# Patient Record
Sex: Female | Born: 1980 | Race: White | Hispanic: No | Marital: Married | Smoking: Never smoker
Health system: Southern US, Community
[De-identification: ages and names within clinical notes are randomized; demographics above are authoritative.]

## PROBLEM LIST (undated history)

## (undated) DIAGNOSIS — Z9289 Personal history of other medical treatment: Secondary | ICD-10-CM

## (undated) DIAGNOSIS — L509 Urticaria, unspecified: Secondary | ICD-10-CM

## (undated) DIAGNOSIS — R51 Headache: Secondary | ICD-10-CM

## (undated) DIAGNOSIS — R519 Headache, unspecified: Secondary | ICD-10-CM

## (undated) DIAGNOSIS — J302 Other seasonal allergic rhinitis: Secondary | ICD-10-CM

## (undated) DIAGNOSIS — K219 Gastro-esophageal reflux disease without esophagitis: Secondary | ICD-10-CM

## (undated) DIAGNOSIS — D649 Anemia, unspecified: Secondary | ICD-10-CM

## (undated) DIAGNOSIS — F419 Anxiety disorder, unspecified: Secondary | ICD-10-CM

## (undated) HISTORY — PX: WISDOM TOOTH EXTRACTION: SHX21

## (undated) HISTORY — DX: Headache: R51

## (undated) HISTORY — DX: Urticaria, unspecified: L50.9

## (undated) HISTORY — DX: Other seasonal allergic rhinitis: J30.2

## (undated) HISTORY — DX: Headache, unspecified: R51.9

## (undated) HISTORY — DX: Gastro-esophageal reflux disease without esophagitis: K21.9

## (undated) HISTORY — DX: Personal history of other medical treatment: Z92.89

## (undated) HISTORY — DX: Anxiety disorder, unspecified: F41.9

---

## 2005-05-28 HISTORY — PX: DILATION AND CURETTAGE OF UTERUS: SHX78

## 2007-12-10 ENCOUNTER — Ambulatory Visit: Payer: Self-pay | Admitting: Ophthalmology

## 2009-07-23 IMAGING — CT CT ORBITS WO/W CM
1 of 2 series · 13 of 30 positions shown, 17 images · non-contrast
Comparison: none

REASON FOR EXAM: lid edema  fax results [phone]
COMMENTS:

PROCEDURE:     CT  - CT ORBITS OR TEMPORAL BONE W/WO  - December 10, 2007  [DATE]
RESULT:
HISTORY: Bilateral lid edema.
COMPARISON STUDIES:   None.

[Series 2: orbits_wo 3.0 h30f · axial · 0.37mm/px · z∈[+218,+290]mm · 13 of 30 slices shown, 17 images]
[im 3/30  brain]
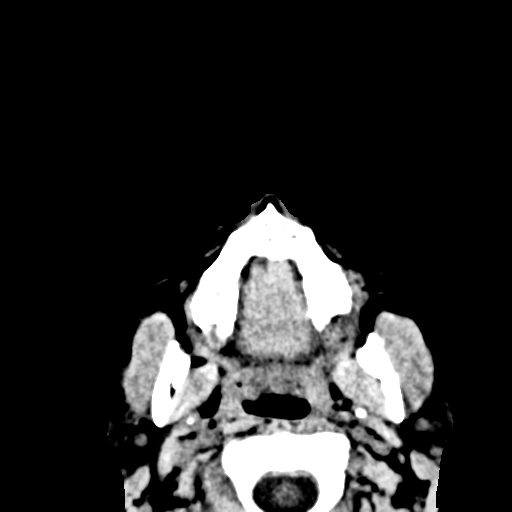
[im 3/30  bone]
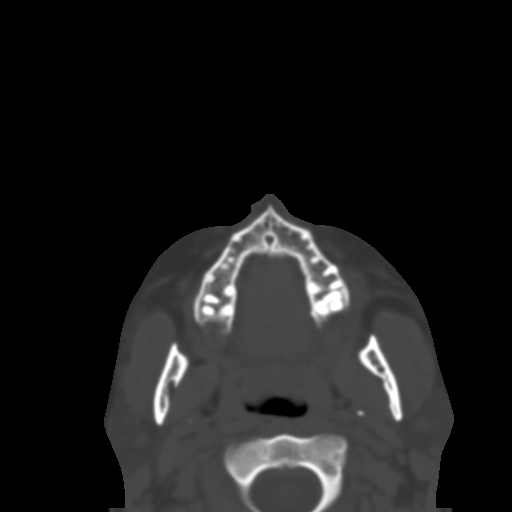
[im 5/30  bone]
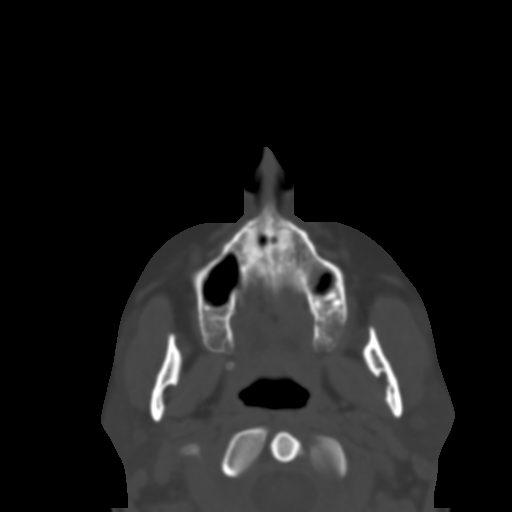
[im 7/30  bone]
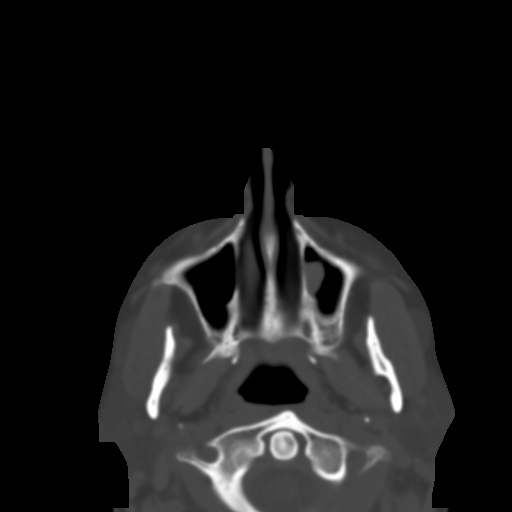
[im 9/30  bone]
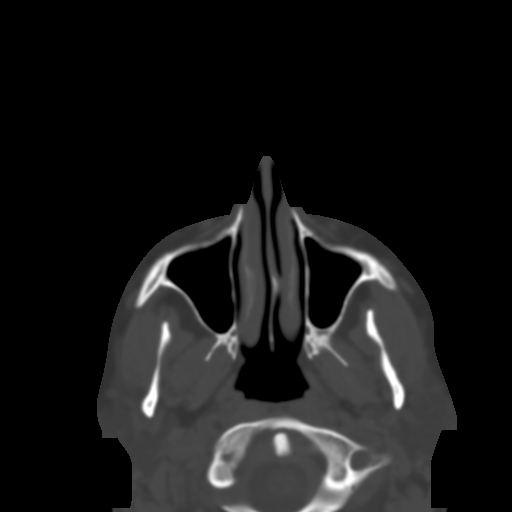
[im 11/30  brain]
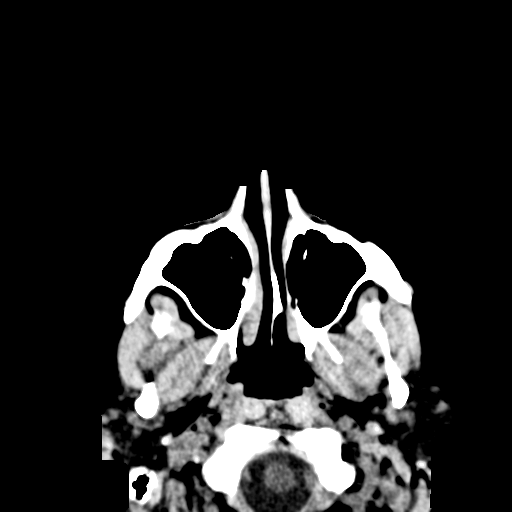
[im 11/30  bone]
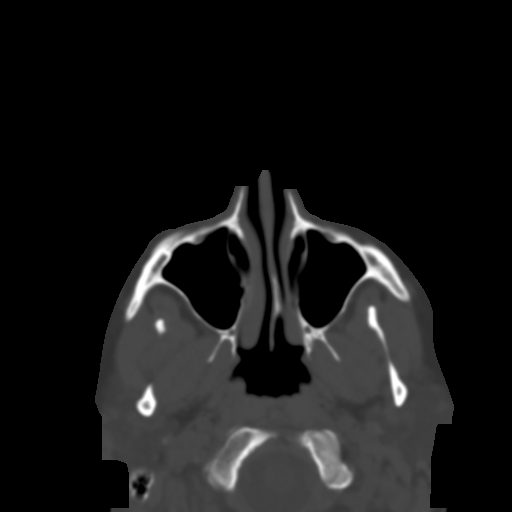
[im 13/30  bone]
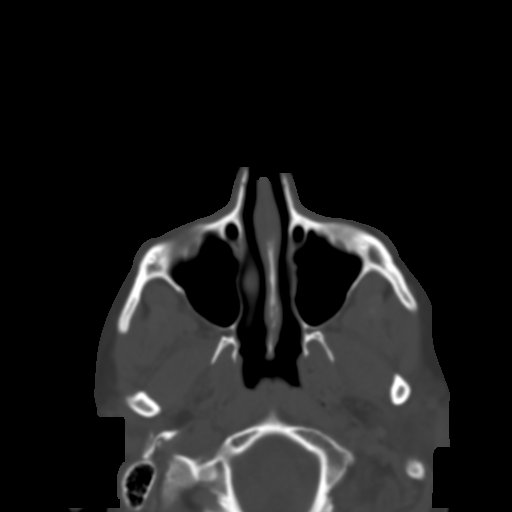
[im 15/30  bone]
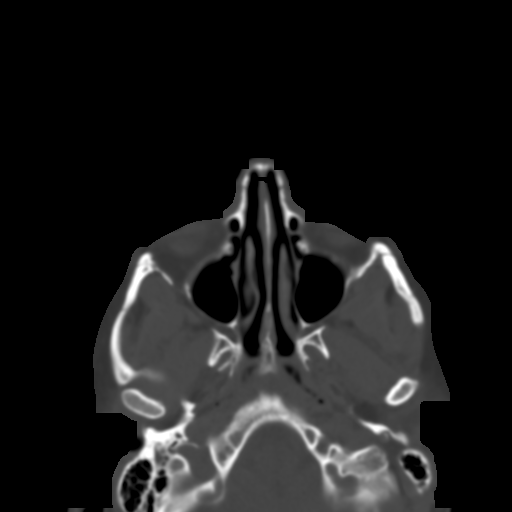
[im 17/30  bone]
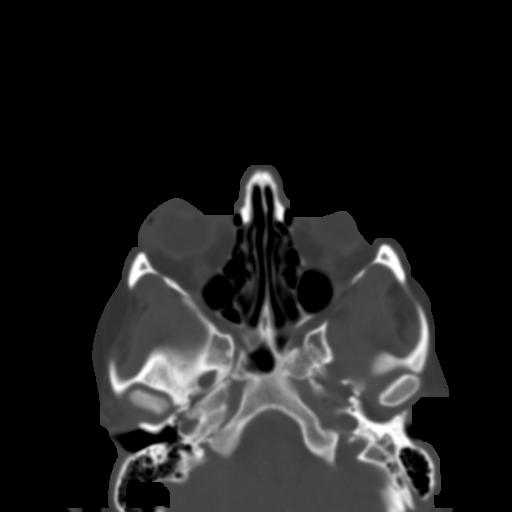
[im 19/30  brain]
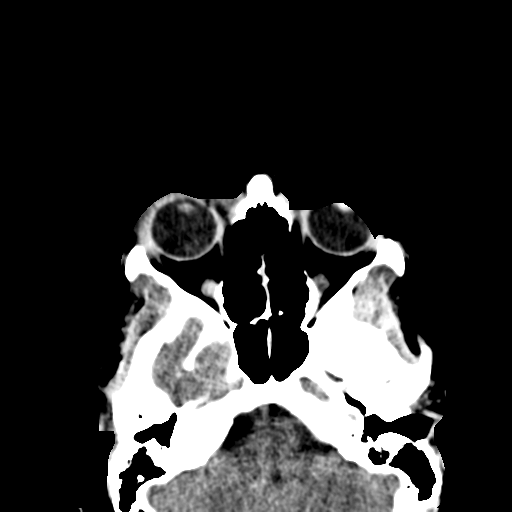
[im 19/30  bone]
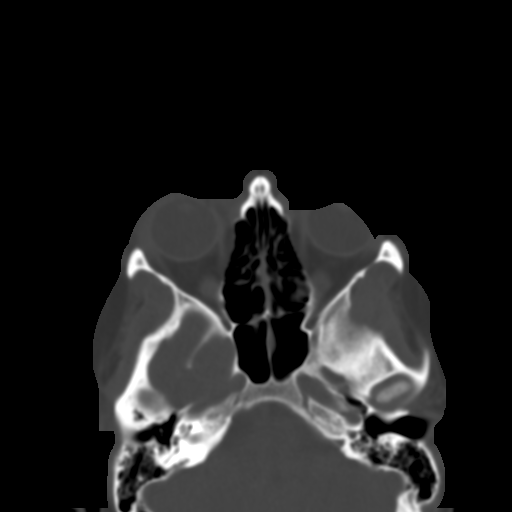
[im 21/30  bone]
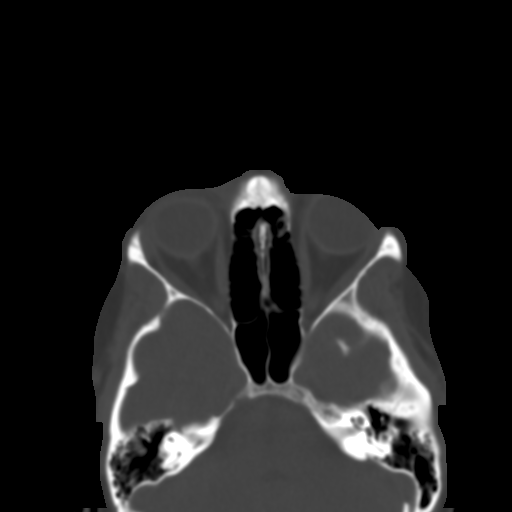
[im 23/30  bone]
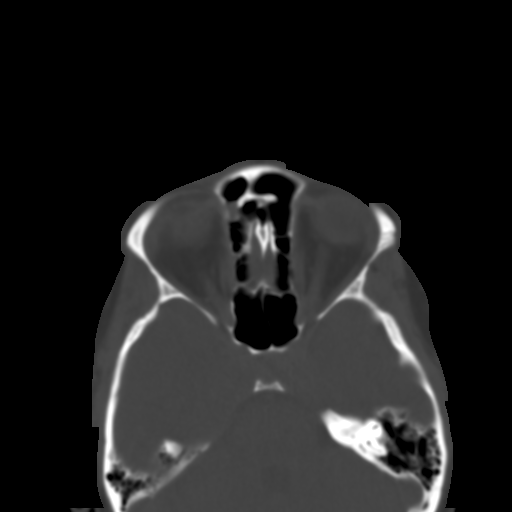
[im 25/30  bone]
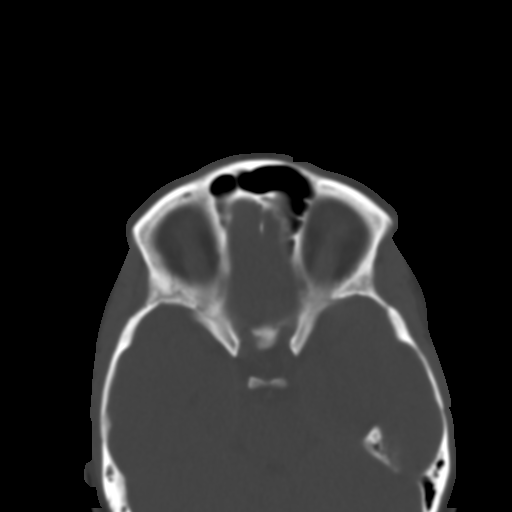
[im 27/30  brain]
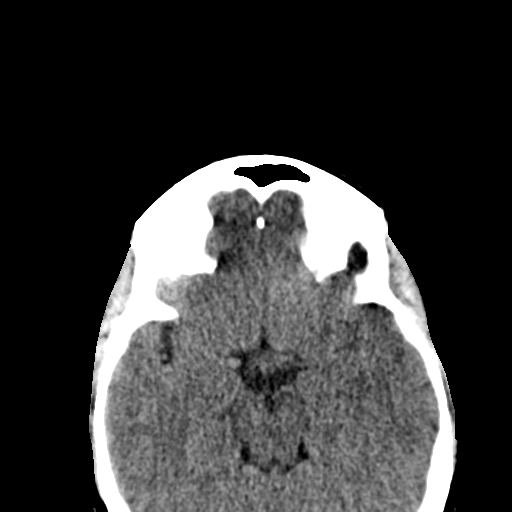
[im 27/30  bone]
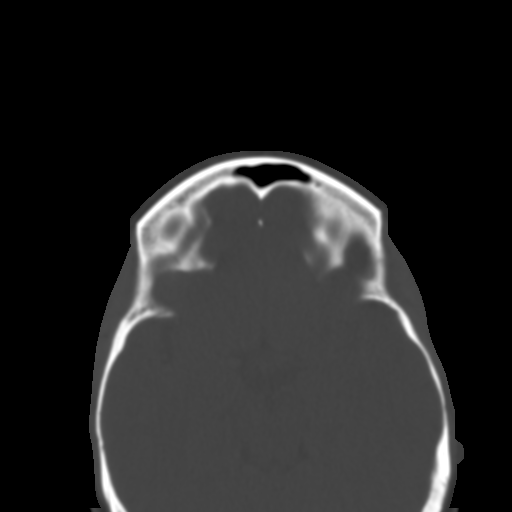

[13 of 30 positions shown; findings below may reference images not displayed]

FINDINGS: Soft tissue swelling is noted about the RIGHT orbit. The RIGHT
globe is intact.  Extraocular eye muscles are normal.  Retrobulbar tissue is
normal.  The LEFT eye and orbit are normal. No evidence of abscess.  The
paranasal sinuses are normal on the RIGHT.  Mild mucosal thickening is noted
in the LEFT maxillary sinus.
IMPRESSION: 1.     Soft tissue swelling, RIGHT orbit. No evidence of abscess. The globe
and retrobulbar tissue are intact.  Any etiology of periorbital soft tissue
swelling including cellulitis could present in this fashion.
2.     Adjacent bony structures and sinuses are normal.

## 2015-05-26 ENCOUNTER — Other Ambulatory Visit: Payer: Self-pay | Admitting: Otolaryngology

## 2015-05-26 DIAGNOSIS — H814 Vertigo of central origin: Secondary | ICD-10-CM

## 2015-06-17 ENCOUNTER — Ambulatory Visit
Admission: RE | Admit: 2015-06-17 | Discharge: 2015-06-17 | Disposition: A | Discharge status: Home / Self Care | Payer: BLUE CROSS/BLUE SHIELD | Source: Ambulatory Visit | Attending: Otolaryngology | Admitting: Otolaryngology

## 2015-06-17 DIAGNOSIS — H8149 Vertigo of central origin, unspecified ear: Secondary | ICD-10-CM | POA: Diagnosis present

## 2015-06-17 DIAGNOSIS — H814 Vertigo of central origin: Secondary | ICD-10-CM

## 2015-06-17 MED ORDER — GADOBENATE DIMEGLUMINE 529 MG/ML IV SOLN
20.0000 mL | Freq: Once | INTRAVENOUS | Status: AC | PRN
  Administered 2015-06-17: 20 mL via INTRAVENOUS

## 2017-12-03 ENCOUNTER — Ambulatory Visit: Payer: Self-pay | Admitting: Certified Nurse Midwife

## 2017-12-23 ENCOUNTER — Ambulatory Visit (INDEPENDENT_AMBULATORY_CARE_PROVIDER_SITE_OTHER): Payer: BLUE CROSS/BLUE SHIELD | Admitting: Certified Nurse Midwife

## 2017-12-23 ENCOUNTER — Encounter: Payer: Self-pay | Admitting: Certified Nurse Midwife

## 2017-12-23 VITALS — BP 118/80 | HR 97 | Ht 66.0 in | Wt 227.0 lb

## 2017-12-23 DIAGNOSIS — Z01419 Encounter for gynecological examination (general) (routine) without abnormal findings: Secondary | ICD-10-CM

## 2017-12-23 DIAGNOSIS — Z1231 Encounter for screening mammogram for malignant neoplasm of breast: Secondary | ICD-10-CM | POA: Diagnosis not present

## 2017-12-23 DIAGNOSIS — Z01411 Encounter for gynecological examination (general) (routine) with abnormal findings: Secondary | ICD-10-CM | POA: Diagnosis not present

## 2017-12-23 DIAGNOSIS — B373 Candidiasis of vulva and vagina: Secondary | ICD-10-CM | POA: Diagnosis not present

## 2017-12-23 DIAGNOSIS — N898 Other specified noninflammatory disorders of vagina: Secondary | ICD-10-CM | POA: Diagnosis not present

## 2017-12-23 DIAGNOSIS — Z30433 Encounter for removal and reinsertion of intrauterine contraceptive device: Secondary | ICD-10-CM

## 2017-12-23 DIAGNOSIS — B3731 Acute candidiasis of vulva and vagina: Secondary | ICD-10-CM

## 2017-12-23 DIAGNOSIS — T8332XA Displacement of intrauterine contraceptive device, initial encounter: Secondary | ICD-10-CM

## 2017-12-23 DIAGNOSIS — Z1239 Encounter for other screening for malignant neoplasm of breast: Secondary | ICD-10-CM

## 2017-12-23 MED ORDER — FLUCONAZOLE 150 MG PO TABS: 150.0000 mg | ORAL_TABLET | Freq: Once | ORAL | 0 refills | Status: AC

## 2017-12-23 NOTE — Progress Notes (Addendum)
Gynecology Annual Exam  PCP: System, Pcp Not In  Chief Complaint:  Chief Complaint  Patient presents with  . Gynecologic Exam    History of Present Illness:Kathleen Lopez is a 37 year old female , G 3 P 2 0 1 1 , who presents for her annual exam Her menses are every 21-22 days apart, lasting 3-4 days, medium flow. Her LMP was 12/07/2017  She has no intermenstrual  spotting.  She denies dysmenorrhea.  The patient's past medical history is notable for a history of migraines involving ear pain/ left parietal pain for which she takes Effexor and nortriptylene and is followed by Dr Malvin JohnsPotter in neuro. She also reports a hx of GERD and hives/ food allergies..  Since her last annual GYN exam 01/09/2016 , she has had no significant changes in her health history.  She is sexually active. She is currently using an IUD for contraception.Her Paragard was placed 05/09/2016.  Her most recent pap smear was obtained 01/09/2016 and was RNIL with negative HRHPV. Mammogram is not applicable.  There is a positive history of breast cancer in her maternal grandmother. Genetic testing has not been done.  There is no family history of ovarian cancer.  The patient does do occ self breast exams.  The patient does not smoke The patient does not drink alcohol.  The patient does not use illegal drugs.  The patient exercises occasionally.  The patient does get adequate calcium in her diet.  She had a recent cholesterol screen in 2016 that was normal.    Review of Systems: Review of Systems  Constitutional: Negative for chills, fever and weight loss.  HENT: Negative for congestion, sinus pain and sore throat.   Eyes: Negative for blurred vision and pain.  Respiratory: Negative for hemoptysis, shortness of breath and wheezing.   Cardiovascular: Negative for chest pain, palpitations and leg swelling.  Gastrointestinal: Positive for constipation. Negative for abdominal pain, blood in stool, diarrhea, heartburn,  nausea and vomiting.  Genitourinary: Negative for dysuria, frequency, hematuria and urgency.  Musculoskeletal: Negative for back pain, joint pain and myalgias.  Skin: Negative for itching and rash.  Neurological: Positive for headaches. Negative for dizziness and tingling.  Endo/Heme/Allergies: Negative for environmental allergies and polydipsia. Does not bruise/bleed easily.       Negative for hirsutism   Psychiatric/Behavioral: Negative for depression. The patient is not nervous/anxious and does not have insomnia.     Past Medical History:  Past Medical History:  Diagnosis Date  . Anxiety   . GERD (gastroesophageal reflux disease)   . Head ache   . History of Papanicolaou smear of cervix    paps nl in past;12/30/15 -/-;  . Urticaria     Past Surgical History:  Past Surgical History:  Procedure Laterality Date  . DILATION AND CURETTAGE OF UTERUS  2007   [facility]  . WISDOM TOOTH EXTRACTION     ~37yr old; two    Family History:  Family History  Problem Relation Age of Onset  . Osteoporosis Mother   . Cancer Father [id number]       prostate  . Breast cancer Maternal Grandmother 7050    Social History:  Social History   Socioeconomic History  . Marital status: Married    Spouse name: Not on file  . Number of children: 2  . Years of education: 2212  . Highest education level: Not on file  Occupational History  . 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 Financial resource strain: Not on file  . Food insecurity:    Worry: Not on file    Inability: Not on file  . Transportation needs:    Medical: Not on file    Non-medical: Not on file  Tobacco Use  . Smoking status: Never Smoker  . Smokeless tobacco: Never Used  Substance and Sexual Activity  . Alcohol use: Never    Frequency: Never  . Drug use: Never  . Sexual activity: Yes    Birth control/protection: IUD    Comment: Paraguard  Lifestyle  . Physical  activity:    Days per week: Not on file    Minutes per session: Not on file  . Stress: Not on file  Relationships  . Social connections:    Talks on phone: Not on file    Gets together: Not on file    Attends religious service: Not on file    Active member of club or organization: Not on file    Attends meetings of clubs or organizations: Not on file    Relationship status: Not on file  . Intimate partner violence:    Fear of current or ex partner: Not on file    Emotionally abused: Not on file    Physically abused: Not on file    Forced sexual activity: Not on file  Other Topics Concern  . Not on file  Social History Narrative  . Not on file    Allergies:  Allergies  Allergen Reactions  . Beef-Derived Products Hives  . Eggs Or Egg-Derived Products Hives    Face, lips, eyes  . Other     seasonal    Medications: Current Outpatient Medications on File Prior to Visit  Medication Sig Dispense Refill  . cetirizine (ZYRTEC) 10 MG tablet Take by mouth.    . Cholecalciferol (VITAMIN D3) 2000 units capsule Take by mouth.    . esomeprazole (NEXIUM) 40 MG capsule Take by mouth.    . Melatonin 5 MG TABS Take by mouth.    . montelukast (SINGULAIR) 10 MG tablet Take by mouth.    . nortriptyline (PAMELOR) 10 MG capsule TAKE 2 CAPSULES BY MOUTH AT BEDTIME    . rizatriptan (MAXALT-MLT) 5 MG disintegrating tablet TAKE 1 TABLET AS NEEDED FOR MIGRAINE,MAY TAKE A 2ND DOSE AFTER 2 HOURS IF NEEDED.    Marland Kitchen venlafaxine XR (EFFEXOR-XR) 75 MG 24 hr capsule Take by mouth.    . CONCERTA 18 MG CR tablet   0   No current facility-administered medications on file prior to visit.      Physical Exam Vitals: BP 118/80   Pulse 97   Ht 5\' 6"  (1.676 m)   Wt 227 lb (103 kg)   LMP 12/07/2017 (Exact Date)   BMI 36.64 kg/m   General: NAD HEENT: normocephalic, anicteric Neck: no thyroid enlargement, no palpable nodules, no cervical lymphadenopathy  Pulmonary: No increased work of breathing,  CTAB Cardiovascular: RRR, without murmur  Breast: Right breast larger than left, no tenderness, no palpable nodules or masses, no skin or nipple retraction present, no nipple discharge.  No axillary, infraclavicular or supraclavicular lymphadenopathy. Abdomen: Soft, non-tender, non-distended.  Umbilicus without lesions.  No hepatomegaly or masses palpable. No evidence of hernia. Genitourinary:  External: Normal external female genitalia.  Normal urethral meatus, normal  Bartholin's and Skene's glands.    Vagina: Normal vaginal mucosa, urethrocele present, white cottage cheese discharge  Cervix: Grossly normal  in appearance, no bleeding, non-tender, stem of IUD palpated outside cx os  Uterus: Anteverted, normal size, shape, and consistency, mobile, and non-tender  Adnexa: No adnexal masses, non-tender  Rectal: deferred  Lymphatic: no evidence of inguinal lymphadenopathy Extremities: no edema, erythema, or tenderness Neurologic: Grossly intact Psychiatric: mood appropriate, affect full  Results for orders placed or performed in visit on 12/23/17 (from the past 24 hour(s))  POCT Wet Prep Mellody [place])     Status: Abnormal   Collection Time: 12/28/17  2:46 PM  Result Value Ref Range   Source Wet Prep POC vagina    WBC, Wet Prep HPF POC     Bacteria Wet Prep HPF POC  Few   BACTERIA WET PREP MORPHOLOGY POC     Clue Cells Wet Prep HPF POC None None   Clue Cells Wet Prep Whiff POC     Yeast Wet Prep HPF POC Many (A) None   KOH Wet Prep POC  None   Trichomonas Wet Prep HPF POC Absent Absent     Assessment: 37 y.o. [id number] annual gyn exam Monilial vaginitis  Diflucan 150 mgmx1 dose  Partial expulsion of Paragard IUD  Plan:   1) Breast cancer screening - recommend monthly self breast exam. Start screening mammograms next year.Marland Kitchen  2) Cervical cancer screening - Pap not done. Desires Pap smears every 3 years. Next one due next year.  3) Contraception - Partially expelled IUD removed  intact. Please see note below for IUD insertion.  4) Routine healthcare maintenance including cholesterol and diabetes screening managed by PCP   5) RTO in one year and prn  Farrel Conners, CNM     GYNECOLOGY OFFICE PROCEDURE NOTE  Kathleen Lopez is a 37 y.o. [id number] WF for Paragard IUD insertion after her IUD was removed for partial expulsion. Has not experienced any abdominal pain or dyspareunia with her previous IUD  IUD Insertion Procedure Note Patient identified, informed consent performed, consent signed.   Discussed risks of irregular bleeding, cramping, infection, expulsion,malpositioning or misplacement of the IUD outside the uterus which may require further procedure such as laparoscopy. Time out was performed.   On bimanual exam, uterus was anteverted to mid position Speculum placed in the vagina.  Cervix visualized.  Cleaned with Betadine x 2. Cervix was sprayed with Hurricaine anesthetic and  grasped anteriorly with a single tooth tenaculum.  Uterus sounded to 6 cm.  Paragard  IUD placed per manufacturer's recommendations.  Strings trimmed to 3 cm. Tenaculum was removed, and silver nitrate was applied to tenaculum sites for hemostasis.  Patient tolerated procedure well.   Patient was given post-procedure instructions.   Patient was also asked to check IUD strings periodically and follow up in 4 weeks for IUD check.  Farrel Conners, CNM 01/20/18

## 2017-12-28 ENCOUNTER — Encounter: Payer: Self-pay | Admitting: Certified Nurse Midwife

## 2017-12-28 DIAGNOSIS — K219 Gastro-esophageal reflux disease without esophagitis: Secondary | ICD-10-CM | POA: Insufficient documentation

## 2017-12-28 DIAGNOSIS — R519 Headache, unspecified: Secondary | ICD-10-CM | POA: Insufficient documentation

## 2017-12-28 DIAGNOSIS — L509 Urticaria, unspecified: Secondary | ICD-10-CM | POA: Insufficient documentation

## 2017-12-28 DIAGNOSIS — J302 Other seasonal allergic rhinitis: Secondary | ICD-10-CM | POA: Insufficient documentation

## 2017-12-28 DIAGNOSIS — R51 Headache: Secondary | ICD-10-CM

## 2017-12-28 DIAGNOSIS — F419 Anxiety disorder, unspecified: Secondary | ICD-10-CM | POA: Insufficient documentation

## 2017-12-28 LAB — POCT WET PREP (WET MOUNT): Trichomonas Wet Prep HPF POC: ABSENT

## 2018-01-20 ENCOUNTER — Telehealth: Payer: Self-pay | Admitting: Obstetrics and Gynecology

## 2018-01-20 ENCOUNTER — Ambulatory Visit (INDEPENDENT_AMBULATORY_CARE_PROVIDER_SITE_OTHER): Payer: BLUE CROSS/BLUE SHIELD | Admitting: Certified Nurse Midwife

## 2018-01-20 ENCOUNTER — Encounter: Payer: Self-pay | Admitting: Certified Nurse Midwife

## 2018-01-20 VITALS — BP 120/70 | HR 84 | Ht 66.0 in | Wt 229.0 lb

## 2018-01-20 DIAGNOSIS — E669 Obesity, unspecified: Secondary | ICD-10-CM

## 2018-01-20 DIAGNOSIS — T8332XA Displacement of intrauterine contraceptive device, initial encounter: Secondary | ICD-10-CM | POA: Diagnosis not present

## 2018-01-20 DIAGNOSIS — Z862 Personal history of diseases of the blood and blood-forming organs and certain disorders involving the immune mechanism: Secondary | ICD-10-CM

## 2018-01-20 DIAGNOSIS — Z13220 Encounter for screening for lipoid disorders: Secondary | ICD-10-CM | POA: Diagnosis not present

## 2018-01-20 DIAGNOSIS — Z6836 Body mass index (BMI) 36.0-36.9, adult: Secondary | ICD-10-CM

## 2018-01-20 DIAGNOSIS — Z131 Encounter for screening for diabetes mellitus: Secondary | ICD-10-CM

## 2018-01-20 NOTE — Telephone Encounter (Signed)
9/9 for kyleena insert with ams at 2:30pm

## 2018-01-20 NOTE — Progress Notes (Signed)
  History of Present Illness:  Kathleen Lopez is a 37 y.o. that had a Paragard IUD placed approximately 1 month ago. Since that time, Kathleen Lopez states that Kathleen Lopez has had no bleeding or pain problems. Kathleen Lopez is also requesting screening labs and labs to check for anemia. Kathleen Lopez was not able to give blood the last 2 times Kathleen Lopez tried to donate because her blood count was not high enough  PMHx: Kathleen Lopez  has a past medical history of Anxiety, GERD (gastroesophageal reflux disease), Head ache, History of Papanicolaou smear of cervix, Seasonal allergies, and Urticaria. Also,  has a past surgical history that includes Dilation and curettage of uterus (2007) and Wisdom tooth extraction., family history includes Breast cancer (age of onset: 5750) in her maternal grandmother; Osteoporosis in her mother; Prostate cancer (age of onset: 3857) in her father; Thyroid disease in her sister.,  reports that Kathleen Lopez has never smoked. Kathleen Lopez has never used smokeless tobacco. Kathleen Lopez reports that Kathleen Lopez does not drink alcohol or use drugs.  Kathleen Lopez has a current medication list which includes the following prescription(s): cetirizine, vitamin d3, concerta, esomeprazole, melatonin, montelukast, nortriptyline, rizatriptan, and venlafaxine xr. Also, is allergic to beef-derived products; eggs or egg-derived products; and other.  ROS  Physical Exam:  BP 120/70   Pulse 84   Ht 5\' 6"  (1.676 m)   Wt 229 lb (103.9 kg)   LMP 01/09/2018   BMI 36.96 kg/m  Body mass index is 36.96 kg/m. Constitutional: Well nourished, well developed female in no acute distress.  Neuro: Grossly intact Psych:  Normal mood and affect.    Pelvic exam: External/BUS: no lesion, no discharge Vagina: no lesions, no bleeding Cervix: Two IUD strings palpated at the  the cervical os along with the stem of the IUD   Assessment: Partial expulsion of the Paragard-for the second time  Plan: Consulted Dr Bonney AidStaebler. Pelvic ultrasound to look for structural abnormality that may be pushing the IUD  out or preventing IUD from being inserted into fundus. If no structural abnormalities present, patient is wanting to try a [place] IUD. Paragard not removed at this time. Advised to use condoms until Kathleen Lopez returns for ultrasound  Patient was amenable to this plan. Would really like BTL, but unable to do so at this time due to financial constraints.  Hemoglobin A1C, lipid panel, TSH, ferritin, CBC done today   [name], CNM

## 2018-01-21 LAB — CBC WITH DIFFERENTIAL/PLATELET
BASOS ABS: 0.1 10*3/uL (ref 0.0–0.2)
Basos: 1 %
EOS (ABSOLUTE): 0.1 10*3/uL (ref 0.0–0.4)
Eos: 1 %
Hematocrit: 34.7 % (ref 34.0–46.6)
Hemoglobin: 10.7 g/dL — ABNORMAL LOW (ref 11.1–15.9)
IMMATURE GRANULOCYTES: 0 %
Immature Grans (Abs): 0 10*3/uL (ref 0.0–0.1)
Lymphocytes Absolute: 3.2 10*3/uL — ABNORMAL HIGH (ref 0.7–3.1)
Lymphs: 29 %
MCH: 23.7 pg — ABNORMAL LOW (ref 26.6–33.0)
MCHC: 30.8 g/dL — ABNORMAL LOW (ref 31.5–35.7)
MCV: 77 fL — ABNORMAL LOW (ref 79–97)
MONOCYTES: 8 %
MONOS ABS: 0.9 10*3/uL (ref 0.1–0.9)
NEUTROS PCT: 61 %
Neutrophils Absolute: 6.7 10*3/uL (ref 1.4–7.0)
Platelets: 479 10*3/uL — ABNORMAL HIGH (ref 150–450)
RBC: 4.51 x10E6/uL (ref 3.77–5.28)
RDW: 16.9 % — AB (ref 12.3–15.4)
WBC: 11 10*3/uL — AB (ref 3.4–10.8)

## 2018-01-21 LAB — LIPID PANEL
CHOLESTEROL TOTAL: 169 mg/dL (ref 100–199)
Chol/HDL Ratio: 3.7 ratio (ref 0.0–4.4)
HDL: 46 mg/dL (ref >39)
LDL CALC: 101 mg/dL — AB (ref 0–99)
Triglycerides: 109 mg/dL (ref 0–149)
VLDL Cholesterol Cal: 22 mg/dL (ref 5–40)

## 2018-01-21 LAB — HEMOGLOBIN A1C
ESTIMATED AVERAGE GLUCOSE: 117 mg/dL
Hgb A1c MFr Bld: 5.7 % — ABNORMAL HIGH (ref 4.8–5.6)

## 2018-01-21 LAB — FERRITIN: FERRITIN: 16 ng/mL (ref 15–150)

## 2018-01-21 LAB — TSH: TSH: 3.32 u[IU]/mL (ref 0.450–4.500)

## 2018-01-21 NOTE — Telephone Encounter (Signed)
Noted. Will order to arrive by apt date/time. 

## 2018-01-24 ENCOUNTER — Encounter: Payer: Self-pay | Admitting: Certified Nurse Midwife

## 2018-01-24 ENCOUNTER — Other Ambulatory Visit: Payer: Self-pay | Admitting: Certified Nurse Midwife

## 2018-01-24 DIAGNOSIS — Z91018 Allergy to other foods: Secondary | ICD-10-CM | POA: Insufficient documentation

## 2018-01-24 DIAGNOSIS — Z91014 Allergy to mammalian meats: Secondary | ICD-10-CM

## 2018-01-24 DIAGNOSIS — D509 Iron deficiency anemia, unspecified: Secondary | ICD-10-CM | POA: Insufficient documentation

## 2018-02-03 ENCOUNTER — Ambulatory Visit (INDEPENDENT_AMBULATORY_CARE_PROVIDER_SITE_OTHER): Payer: BLUE CROSS/BLUE SHIELD | Admitting: Obstetrics and Gynecology

## 2018-02-03 ENCOUNTER — Ambulatory Visit (INDEPENDENT_AMBULATORY_CARE_PROVIDER_SITE_OTHER): Payer: BLUE CROSS/BLUE SHIELD

## 2018-02-03 ENCOUNTER — Encounter: Payer: Self-pay | Admitting: Obstetrics and Gynecology

## 2018-02-03 VITALS — BP 106/72 | HR 84 | Wt 229.0 lb

## 2018-02-03 DIAGNOSIS — Z30011 Encounter for initial prescription of contraceptive pills: Secondary | ICD-10-CM

## 2018-02-03 DIAGNOSIS — T8332XD Displacement of intrauterine contraceptive device, subsequent encounter: Secondary | ICD-10-CM

## 2018-02-03 DIAGNOSIS — Z30432 Encounter for removal of intrauterine contraceptive device: Secondary | ICD-10-CM | POA: Diagnosis not present

## 2018-02-03 DIAGNOSIS — T8332XA Displacement of intrauterine contraceptive device, initial encounter: Secondary | ICD-10-CM | POA: Diagnosis not present

## 2018-02-03 MED ORDER — NORETHIN-ETH ESTRAD-FE BIPHAS 1 MG-10 MCG / 10 MCG PO TABS: 1.0000 | ORAL_TABLET | Freq: Every day | ORAL | 3 refills | Status: DC

## 2018-02-03 NOTE — Progress Notes (Signed)
Gynecology Ultrasound Follow Up  Chief Complaint:  Chief Complaint  Patient presents with  . Follow-up    GYN U/S      History of Present Illness: Patient is a 37 y.o. female who presents today for ultrasound evaluation of concern for IUD migration.  Ultrasound demonstrates the following findgins Adnexa: no masses seen Uterus: Normal in size and contour, with endometrial stripe  Without focal abnormalities Additional: no free fluid, the body of the IUD appears to have migrated into the cervical canal  The patient has not experienced significant cramping or bleeding.  Patient is a 37 y.o. [id number] presenting for contraception consult.  She is currently on IUD and desiring to start OCP (estrogen/progesterone).  She has a past medical history significant for no contraindication to estrogen.  She specifically denies a history of migraine with aura, chronic hypertension, history of DVT/PE and smoking.  Reported Patient's last menstrual period was 01/09/2018.Marland Kitchen      Review of Systems: Review of Systems  Constitutional: Negative.   Gastrointestinal: Negative.   Genitourinary: Negative.   Neurological: Positive for headaches.    Past Medical History:  Past Medical History:  Diagnosis Date  . Anxiety   . GERD (gastroesophageal reflux disease)   . Head ache   . History of Papanicolaou smear of cervix    paps nl in past;12/30/15 -/-;  . Seasonal allergies   . Urticaria     Past Surgical History:  Past Surgical History:  Procedure Laterality Date  . DILATION AND CURETTAGE OF UTERUS  2007   miscarriage; [facility]  . WISDOM TOOTH EXTRACTION     ~37yr old; two    Gynecologic History:  Patient's last menstrual period was 01/09/2018. Contraception: IUD Last Pap: 01/09/2016 Results were: .no abnormalities  Family History:  Family History  Problem Relation Age of Onset  . Osteoporosis Mother   . Prostate cancer Father 73  . Breast cancer Maternal Grandmother 50  .  Thyroid disease Sister     Social History:  Social History   Socioeconomic History  . Marital status: Married    Spouse name: Not on file  . Number of children: 2  . Years of education: 47  . Highest education level: Not on file  Occupational History  . Occupation: PROGRAM DIRECTOR     Comment: [employer]  Social Needs  . Financial resource strain: Not on file  . Food insecurity:    Worry: Not on file    Inability: Not on file  . Transportation needs:    Medical: Not on file    Non-medical: Not on file  Tobacco Use  . Smoking status: Never Smoker  . Smokeless tobacco: Never Used  Substance and Sexual Activity  . Alcohol use: Never    Frequency: Never  . Drug use: Never  . Sexual activity: Yes    Partners: Male    Birth control/protection: IUD    Comment: Paraguard  Lifestyle  . Physical activity:    Days per week: Not on file    Minutes per session: Not on file  . Stress: Not on file  Relationships  . Social connections:    Talks on phone: Not on file    Gets together: Not on file    Attends religious service: Not on file    Active member of club or organization: Not on file    Attends meetings of clubs or organizations: Not on file    Relationship status: Not on  file  . Intimate partner violence:    Fear of current or ex partner: Not on file    Emotionally abused: Not on file    Physically abused: Not on file    Forced sexual activity: Not on file  Other Topics Concern  . Not on file  Social History Narrative  . Not on file    Allergies:  Allergies  Allergen Reactions  . Beef-Derived Products Hives  . Eggs Or Egg-Derived Products Hives    Face, lips, eyes  . Other     seasonal    Medications: Prior to Admission medications   Medication Sig Start Date End Date Taking? Authorizing Provider  cetirizine (ZYRTEC) 10 MG tablet Take by mouth.    [provider]  Cholecalciferol (VITAMIN D3) 2000 units capsule Take by mouth.     [provider]  CONCERTA 27 MG CR tablet Take 1 tablet by mouth daily. 01/07/18   [provider]  esomeprazole (NEXIUM) 40 MG capsule Take by mouth.    [provider]  Melatonin 5 MG TABS Take by mouth.    [provider]  montelukast (SINGULAIR) 10 MG tablet Take by mouth.    [provider]  nortriptyline (PAMELOR) 10 MG capsule TAKE 2 CAPSULES BY MOUTH AT BEDTIME 06/11/16   [provider]  rizatriptan (MAXALT-MLT) 5 MG disintegrating tablet TAKE 1 TABLET AS NEEDED FOR MIGRAINE,MAY TAKE A 2ND DOSE AFTER 2 HOURS IF NEEDED. 08/09/16   [provider]  venlafaxine XR (EFFEXOR-XR) 75 MG 24 hr capsule Take by mouth. 09/11/16   [provider]    Physical Exam Vitals: Last menstrual period 01/09/2018.  General: NAD HEENT: normocephalic, anicteric Pulmonary: No increased work of breathing Extremities: no edema, erythema, or tenderness Neurologic: Grossly intact, normal gait Psychiatric: mood appropriate, affect full   GYNECOLOGY OFFICE PROCEDURE NOTE  Tashi Shober is a 37 y.o. [id number] here for Paragard IUD removal placed 12/23/2017. She desires removal secondary to migration of IUD..  IUD Removal  Patient identified, informed consent performed, consent signed.  Patient was in the dorsal lithotomy position, normal external genitalia was noted.  A speculum was placed in the patient's vagina, normal discharge was noted, no lesions. The cervix was visualized, no lesions, no abnormal discharge.  The strings of the IUD were grasped and pulled using ring forceps. The IUD was removed in its entirety. Patient tolerated the procedure well.    Patient will use OCP for contraception  Routine preventative health maintenance measures emphasized.  US Pelvis Transvanginal Non-ob (tv Only)  Result Date: 02/03/2018 Patient Name: Coren Husain DOB: 23-Nov-1980 MRN: [id number] ULTRASOUND REPORT Location: [facility] Date of Service: 02/03/2018  Indications:IUD complication Findings: The uterus is anteverted and measures 9.41x7.01x5.09cm. Echo texture is homogenous without evidence of focal masses. The Endometrium measures 9.73 mm. IUD is seen in the endocervical canal Right Ovary measures 2.35x1.94x1.77 cm. It is normal in appearance. Left Ovary measures 2.75x1.83x1.47 cm. It is normal in appearance. Survey of the adnexa demonstrates no adnexal masses. There is no free fluid in the cul de sac. Impression: 1.IUD is seen in the endocervical canal Recommendations: 1.Clinical correlation with the patient's History and Physical Exam. Augustine Radar, RDMS Images reviewed.  Normal GYN study without visualized pathology.   The IUD is inappropriately positioned within the cervical canal.   Vena Austria, MD, [facility], [facility] 02/03/2018, 4:26 PM     Assessment: 37 y.o. [id number] IUD removal, initiation of oral contraceptive  Plan: Problem List Items Addressed This Visit    None    Visit Diagnoses    Displacement of intrauterine contraceptive device, subsequent encounter    -  Primary   Initiation of oral contraception          1) IUD removed trial of loloestrin.  Patient plans OCPs for contraception. Pros and cons and systemic estrogen discussed with patient. She is not breast feeding, nor does she have any other medical contra-indications to estrogen use as listed in WHO guidelines for contraceptive use.  While effective at preventing pregnancy combination oral contraceptive pills do not prevent transmission of sexually transmitted diseases and use of barrier methods for this purpose was discussed.   The patient does have migraines without aura.  We discussed if worsening of migraines consideration of alternative options.  She may ultimately be interested in proceeding with BTL.  2) A total of 15 minutes were spent in face-to-face contact with the patient during this encounter with over half of that time devoted  to counseling and coordination of care.  3) Return in about 1 year (around 02/04/2019) for annual.      Vena Austria, MD, [facility], [facility] 02/03/2018, 2:55 PM

## 2018-02-27 ENCOUNTER — Telehealth: Payer: Self-pay | Admitting: Obstetrics and Gynecology

## 2018-02-27 NOTE — Telephone Encounter (Signed)
Patient given oral bc at last appointment but states this is causing headaches, was told if this happened to let us know and something else could be sent in.  Google.

## 2018-02-28 ENCOUNTER — Other Ambulatory Visit: Payer: Self-pay | Admitting: Obstetrics and Gynecology

## 2018-02-28 MED ORDER — NORETHINDRONE 0.35 MG PO TABS: 1.0000 | ORAL_TABLET | Freq: Every day | ORAL | 11 refills | Status: DC

## 2018-03-13 ENCOUNTER — Telehealth: Payer: Self-pay

## 2018-03-13 NOTE — Telephone Encounter (Signed)
Pt states she has tried different types of birthcontrol and has decided she thinks she would like to have her tubes tied the beginning of the year. She wants to know what she needs to do? She was recently seen and wants AMS to call her when he can. [phone]

## 2018-03-18 ENCOUNTER — Telehealth: Payer: Self-pay | Admitting: Obstetrics and Gynecology

## 2018-03-18 NOTE — Telephone Encounter (Signed)
-----   Message from Vena Austria, MD sent at 03/17/2018  1:35 PM EDT ----- Regarding: Surgery Surgery Date: Anytime that patient desires  LOS: same day surgery  Surgery Booking Request Patient Full Name: Kathleen Lopez MRN: [id number]  DOB: [birth year]  Surgeon: Vena Austria, MD  Requested Surgery Date and Time: Anytime Primary Diagnosis and Code: Undesired fertility Secondary Diagnosis and Code:  Surgical Procedure: laparoscopic tubal ligation L&D Notification:N/A Admission Status: same day surgery Length of Surgery: 1hr Special Case Needs: none H&P:  (date) Phone Interview or Office Pre-Admit: phone interview Interpreter: No Language: English Medical Clearance: No Special Scheduling Instructions: none

## 2018-03-18 NOTE — Telephone Encounter (Signed)
No answer , vm is full

## 2018-03-20 NOTE — Telephone Encounter (Signed)
Patient would like to be scheduled in January. We agreed that I would contact her when I have the January OR schedule. Patient confirmed [employer] and secondary [employer], and confirmed she will have the same policies next year. Per patient, she has already contacted [employer] and was told her surgery would be covered at 100%.

## 2018-03-20 NOTE — Telephone Encounter (Signed)
Sending back

## 2018-03-21 ENCOUNTER — Telehealth: Payer: Self-pay | Admitting: Obstetrics and Gynecology

## 2018-03-21 NOTE — Telephone Encounter (Signed)
Patient is aware of H&P at [facility] on 1/13/120 @ 2:50pm w/ Dr Bonney Aid, Pre-admit Testing phone interview to be scheduled, and OR on 06/19/18. Patient is aware she may receive calls from the [facility] and [facility]. 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 Ext given.

## 2018-05-07 ENCOUNTER — Telehealth: Payer: Self-pay

## 2018-05-07 NOTE — Telephone Encounter (Signed)
Pt sched for tubal removal 06/19/18 and needs to know how much time to put in for at work.  [phone]  Pt aware 2-4 weeks.

## 2018-06-09 ENCOUNTER — Ambulatory Visit (INDEPENDENT_AMBULATORY_CARE_PROVIDER_SITE_OTHER): Payer: BLUE CROSS/BLUE SHIELD | Admitting: Obstetrics and Gynecology

## 2018-06-09 ENCOUNTER — Encounter: Payer: Self-pay | Admitting: Obstetrics and Gynecology

## 2018-06-09 VITALS — BP 112/70 | HR 99 | Ht 66.0 in | Wt 231.0 lb

## 2018-06-09 DIAGNOSIS — Z01818 Encounter for other preprocedural examination: Secondary | ICD-10-CM

## 2018-06-09 NOTE — Progress Notes (Signed)
Obstetrics & Gynecology Surgery H&P    Chief Complaint: Scheduled Surgery   History of Present Illness: Patient is a 38 y.o. [id number] presenting for scheduled laparoscopic bilateral salpingectomy, for the treatment or further evaluation of undesired fertility.   Prior Treatments prior to proceeding with surgery include: Mirena IUD  Preoperative Pap: 01/09/2016 Results: NIL HPV negative  Preoperative Endometrial biopsy: N/A Preoperative Ultrasound:02/03/18 Findings: IUD lower uterine segement   Review of Systems:10 point review of systems  Past Medical History:  Past Medical History:  Diagnosis Date  . Anxiety   . GERD (gastroesophageal reflux disease)   . Head ache   . History of Papanicolaou smear of cervix    paps nl in past;12/30/15 -/-;  . Seasonal allergies   . Urticaria     Past Surgical History:  Past Surgical History:  Procedure Laterality Date  . DILATION AND CURETTAGE OF UTERUS  2007   [facility]  . WISDOM TOOTH EXTRACTION     ~[age]yr old; two    Family History:  Family History  Problem Relation Age of Onset  . Osteoporosis Mother   . Prostate cancer Father [id number]  . Breast cancer Maternal Grandmother 50  . Thyroid disease Sister     Social History:  Social History   Socioeconomic History  . Marital status: Married    Spouse name: Not on file  . Number of children: 2  . Years of education: 4212  . Highest education level: Not on file  Occupational History  . Occupation: PROGRAM DIRECTOR     Comment: [employer]  Social Needs  . Financial resource strain: Not on file  . Food insecurity:    Worry: Not on file    Inability: Not on file  . Transportation needs:    Medical: Not on file    Non-medical: Not on file  Tobacco Use  . Smoking status: Never Smoker  . Smokeless tobacco: Never Used  Substance and Sexual Activity  . Alcohol use: Never    Frequency: Never  . Drug use: Never  . Sexual activity: Yes   Partners: Male    Birth control/protection: I.U.D.    Comment: Par[employer]  Lifestyle  . Physical activity:    Days per week: Not on file    Minutes per session: Not on file  . Stress: Not on file  Relationships  . Social connections:    Talks on phone: Not on file    Gets together: Not on file    Attends religious service: Not on file    Active member of club or organization: Not on file    Attends meetings of clubs or organizations: Not on file    Relationship status: Not on file  . Intimate partner violence:    Fear of current or ex partner: Not on file    Emotionally abused: Not on file    Physically abused: Not on file    Forced sexual activity: Not on file  Other Topics Concern  . Not on file  Social History Narrative  . Not on file    Allergies:  Allergies  Allergen Reactions  . Beef-Derived Products Hives  . Eggs Or Egg-Derived Products Hives and Other (See Comments)    Face, lips, eyes    Medications: Prior to Admission medications   Medication Sig Start Date End Date Taking? Authorizing Provider  cetirizine (ZYRTEC) 10 MG tablet Take 10 mg by mouth daily.    Yes [provider]  CONCERTA 27 MG CR tablet Take 27 mg by mouth daily.  01/07/18  Yes [provider]  Melatonin 5 MG TABS Take 5 mg by mouth at bedtime.    Yes [provider]  montelukast (SINGULAIR) 10 MG tablet Take 10 mg by mouth daily.    Yes [provider]  nortriptyline (PAMELOR) 10 MG capsule Take 20 mg by mouth at bedtime.  06/11/16  Yes [provider]  rizatriptan (MAXALT-MLT) 5 MG disintegrating tablet Take 5 mg by mouth as needed for migraine.  08/09/16  Yes [provider]  venlafaxine XR (EFFEXOR-XR) 75 MG 24 hr capsule Take 75 mg by mouth daily with breakfast.  09/11/16  Yes [provider]    Physical Exam Vitals: Blood pressure 112/70, pulse 99, height 5\' 6"  (1.676 m), weight 231 lb (104.8 kg), last menstrual period  06/06/2018. General: NAD HEENT: normocephalic, anicteric Pulmonary: No increased work of breathing, CTAB Cardiovascular: RRR, distal pulses 2+ Abdomen: soft, non-tender, non-distended Genitourinary: deferred Extremities: no edema, erythema, or tenderness Neurologic: Grossly intact Psychiatric: mood appropriate, affect full  Imaging No results found.  Assessment: 38 y.o. [id number] presenting for scheduled laparoscopic bilateral salpingectomy  Plan: 1) 38 y.o. [id number]  with undesired fertility, desires permanent sterilization.  Other reversible forms of contraception were discussed with patient; she declines all other modalities. Permanent nature of as well as associated risks of the procedure discussed with patient including but not limited to: risk of regret, permanence of method, bleeding, infection, injury to surrounding organs and need for additional procedures.  Failure risk of 0.5-1% with increased risk of ectopic gestation if pregnancy occurs was also discussed with patient.     2) Routine postoperative instructions were reviewed with the patient and her family in detail today including the expected length of recovery and likely postoperative course.  The patient concurred with the proposed plan, giving informed written consent for the surgery today.  Patient instructed on the importance of being NPO after midnight prior to her procedure.  If warranted preoperative prophylactic antibiotics and SCDs ordered on call to the OR to meet SCIP guidelines and adhere to recommendation laid forth in ACOG Practice Bulletin Number 104 May 2009  "Antibiotic Prophylaxis for Gynecologic Procedures".     Vena AustriaAndreas Jerra Huckeby, MD, Merlinda FrederickFACOG [facility], [facility] 06/09/2018, 3:44 PM

## 2018-06-10 ENCOUNTER — Inpatient Hospital Stay: Admission: RE | Admit: 2018-06-10 | Payer: BLUE CROSS/BLUE SHIELD | Source: Ambulatory Visit

## 2018-06-11 ENCOUNTER — Encounter: Admission: RE | Admit: 2018-06-11 | Payer: BLUE CROSS/BLUE SHIELD | Source: Ambulatory Visit

## 2018-06-12 ENCOUNTER — Inpatient Hospital Stay: Admission: RE | Admit: 2018-06-12 | Payer: BLUE CROSS/BLUE SHIELD | Source: Ambulatory Visit

## 2018-06-13 ENCOUNTER — Other Ambulatory Visit: Payer: Self-pay

## 2018-06-13 ENCOUNTER — Encounter
Admission: RE | Admit: 2018-06-13 | Discharge: 2018-06-13 | Disposition: A | Discharge status: Home / Self Care | Payer: BLUE CROSS/BLUE SHIELD | Source: Ambulatory Visit | Attending: Obstetrics and Gynecology | Admitting: Obstetrics and Gynecology

## 2018-06-13 HISTORY — DX: Anemia, unspecified: D64.9

## 2018-06-13 NOTE — Patient Instructions (Addendum)
Your procedure is scheduled on: 06-19-18 THURSDAY Report to Same Day Surgery 2nd floor medical mall [facility] Entrance-take elevator on left to 2nd floor.  Check in with surgery information desk.) To find out your arrival time please call [phone] between 1PM - 3PM on 06-18-18 [facility]  Remember: Instructions that are not followed completely may result in serious medical risk, up to and including death, or upon the discretion of your surgeon and anesthesiologist your surgery may need to be rescheduled.    _x___ 1. Do not eat food after midnight the night before your procedure. NO GUM OR CANDY AFTER MIDNIGHT.  You may drink clear liquids up to 2 hours before you are scheduled to arrive at the hospital for your procedure.  Do not drink clear liquids within 2 hours of your scheduled arrival to the hospital.  Clear liquids include  --Water or Apple juice without pulp  --Clear carbohydrate beverage such as ClearFast or Gatorade  --Black Coffee or Clear Tea (No milk, no creamers, do not add anything to the coffee or Tea   ____Ensure clear carbohydrate drink on the way to the hospital for bariatric patients  _x___Ensure clear carbohydrate drink 3 hours before surgery     __x__ 2. No Alcohol for 24 hours before or after surgery.   __x__3. No Smoking or e-cigarettes for 24 prior to surgery.  Do not use any chewable tobacco products for at least 6 hour prior to surgery   ____  4. Bring all medications with you on the day of surgery if instructed.    __x__ 5. Notify your doctor if there is any change in your medical condition     (cold, fever, infections).    x___6. On the morning of surgery brush your teeth with toothpaste and water.  You may rinse your mouth with mouth wash if you wish.  Do not swallow any toothpaste or mouthwash.   Do not wear jewelry, make-up, hairpins, clips or nail polish.  Do not wear lotions, powders, or perfumes. You may wear deodorant.  Do not shave 48 hours  prior to surgery. Men may shave face and neck.  Do not bring valuables to the hospital.    [facility] is not responsible for any belongings or valuables.               Contacts, dentures or bridgework may not be worn into surgery.  Leave your suitcase in the car. After surgery it may be brought to your room.  For patients admitted to the hospital, discharge time is determined by your treatment team.  _  Patients discharged the day of surgery will not be allowed to drive home.  You will need someone to drive you home and stay with you the night of your procedure.    Please read over the following fact sheets that you were given:   [facility] Preparing for Surgery   _x___ TAKE THE FOLLOWING MEDICATION THE MORNING OF SURGERY WITH A SMALL SIP OF WATER. These include:  1. ZANTAC (RANITIDINE)  2. TAKE A ZANTAC THE NIGHT BEFORE YOUR SURGERY  3.  4.  5.  6.  ____Fleets enema or Magnesium Citrate as directed.   _x___ Use CHG Soap or sage wipes as directed on instruction sheet   ____ Use inhalers on the day of surgery and bring to hospital day of surgery  ____ Stop Metformin and Janumet 2 days prior to surgery.    ____ Take 1/2 of usual insulin dose the  night before surgery and none on the morning surgery.   ____ Follow recommendations from Cardiologist, Pulmonologist or PCP regarding stopping Aspirin, Coumadin, Plavix ,Eliquis, Effient, or Pradaxa, and Pletal.  X____Stop Anti-inflammatories such as Advil, Aleve, Ibuprofen, Motrin, Naproxen, Naprosyn, Goodies powders or aspirin products NOW-OK to take Tylenol   _x___ Stop supplements until after surgery-STOP MELATONIN NOW-MAY RESUME AFTER SURGERY   ____ Bring C-Pap to the hospital.

## 2018-06-17 ENCOUNTER — Telehealth: Payer: Self-pay | Admitting: Obstetrics and Gynecology

## 2018-06-17 NOTE — Telephone Encounter (Signed)
Patient cancelled surgery due to an insurance issue.

## 2018-06-19 ENCOUNTER — Encounter: Admission: RE | Payer: Self-pay | Source: Home / Self Care

## 2018-06-19 ENCOUNTER — Ambulatory Visit
Admission: RE | Admit: 2018-06-19 | Payer: BLUE CROSS/BLUE SHIELD | Source: Home / Self Care | Admitting: Obstetrics and Gynecology

## 2018-06-19 SURGERY — SALPINGECTOMY, BILATERAL, LAPAROSCOPIC
Anesthesia: Choice | Laterality: Bilateral

## 2018-06-30 ENCOUNTER — Ambulatory Visit: Payer: BLUE CROSS/BLUE SHIELD | Admitting: Obstetrics and Gynecology

## 2019-07-14 NOTE — Telephone Encounter (Signed)
Decided OCP's. No device rcvd.
